# Patient Record
Sex: Female | Born: 1960 | Race: White | Hispanic: No | Marital: Married | Smoking: Former smoker
Health system: Southern US, Community
[De-identification: ages and names within clinical notes are randomized; demographics above are authoritative.]

## PROBLEM LIST (undated history)

## (undated) DIAGNOSIS — E039 Hypothyroidism, unspecified: Secondary | ICD-10-CM

## (undated) DIAGNOSIS — M549 Dorsalgia, unspecified: Secondary | ICD-10-CM

## (undated) DIAGNOSIS — D219 Benign neoplasm of connective and other soft tissue, unspecified: Secondary | ICD-10-CM

## (undated) HISTORY — PX: TONSILLECTOMY: SUR1361

## (undated) HISTORY — DX: Benign neoplasm of connective and other soft tissue, unspecified: D21.9

## (undated) HISTORY — DX: Dorsalgia, unspecified: M54.9

## (undated) HISTORY — DX: Hypothyroidism, unspecified: E03.9

## (undated) HISTORY — PX: WISDOM TOOTH EXTRACTION: SHX21

## (undated) HISTORY — PX: CLAVICLE SURGERY: SHX598

---

## 1998-10-13 ENCOUNTER — Ambulatory Visit (HOSPITAL_COMMUNITY): Admission: RE | Admit: 1998-10-13 | Discharge: 1998-10-13 | Payer: Self-pay

## 1998-10-13 ENCOUNTER — Emergency Department (HOSPITAL_COMMUNITY): Admission: EM | Admit: 1998-10-13 | Discharge: 1998-10-13 | Payer: Self-pay | Admitting: Emergency Medicine

## 2000-05-06 ENCOUNTER — Other Ambulatory Visit: Admission: RE | Admit: 2000-05-06 | Discharge: 2000-05-06 | Payer: Self-pay | Admitting: Family Medicine

## 2001-06-04 ENCOUNTER — Other Ambulatory Visit: Admission: RE | Admit: 2001-06-04 | Discharge: 2001-06-04 | Payer: Self-pay | Admitting: Obstetrics and Gynecology

## 2001-06-04 ENCOUNTER — Encounter (INDEPENDENT_AMBULATORY_CARE_PROVIDER_SITE_OTHER): Payer: Self-pay | Admitting: Specialist

## 2001-07-20 ENCOUNTER — Other Ambulatory Visit: Admission: RE | Admit: 2001-07-20 | Discharge: 2001-07-20 | Payer: Self-pay | Admitting: Internal Medicine

## 2001-09-22 HISTORY — PX: CERVICAL BIOPSY  W/ LOOP ELECTRODE EXCISION: SUR135

## 2002-01-29 ENCOUNTER — Other Ambulatory Visit: Admission: RE | Admit: 2002-01-29 | Discharge: 2002-01-29 | Payer: Self-pay | Admitting: Obstetrics and Gynecology

## 2002-02-06 ENCOUNTER — Emergency Department (HOSPITAL_COMMUNITY): Admission: EM | Admit: 2002-02-06 | Discharge: 2002-02-06 | Payer: Self-pay | Admitting: Emergency Medicine

## 2002-08-24 ENCOUNTER — Other Ambulatory Visit: Admission: RE | Admit: 2002-08-24 | Discharge: 2002-08-24 | Payer: Self-pay | Admitting: Obstetrics and Gynecology

## 2003-04-25 ENCOUNTER — Encounter (INDEPENDENT_AMBULATORY_CARE_PROVIDER_SITE_OTHER): Payer: Self-pay | Admitting: Specialist

## 2003-04-25 ENCOUNTER — Ambulatory Visit (HOSPITAL_COMMUNITY): Admission: RE | Admit: 2003-04-25 | Discharge: 2003-04-25 | Payer: Self-pay | Admitting: Family Medicine

## 2003-10-04 ENCOUNTER — Other Ambulatory Visit: Admission: RE | Admit: 2003-10-04 | Discharge: 2003-10-04 | Payer: Self-pay | Admitting: Obstetrics and Gynecology

## 2005-01-31 ENCOUNTER — Other Ambulatory Visit: Admission: RE | Admit: 2005-01-31 | Discharge: 2005-01-31 | Payer: Self-pay | Admitting: Obstetrics and Gynecology

## 2005-05-08 ENCOUNTER — Encounter: Admission: RE | Admit: 2005-05-08 | Discharge: 2005-08-06 | Payer: Self-pay | Admitting: Family Medicine

## 2005-08-21 ENCOUNTER — Encounter: Admission: RE | Admit: 2005-08-21 | Discharge: 2005-11-19 | Payer: Self-pay | Admitting: Sports Medicine

## 2006-03-06 ENCOUNTER — Encounter: Admission: RE | Admit: 2006-03-06 | Discharge: 2006-06-04 | Payer: Self-pay | Admitting: Physician Assistant

## 2006-12-30 ENCOUNTER — Ambulatory Visit: Payer: Self-pay | Admitting: Family Medicine

## 2007-01-01 ENCOUNTER — Ambulatory Visit: Payer: Self-pay | Admitting: Family Medicine

## 2007-01-15 ENCOUNTER — Ambulatory Visit: Payer: Self-pay | Admitting: Family Medicine

## 2007-01-21 ENCOUNTER — Ambulatory Visit: Payer: Self-pay | Admitting: Family Medicine

## 2007-01-22 ENCOUNTER — Ambulatory Visit: Payer: Self-pay | Admitting: Family Medicine

## 2007-01-29 ENCOUNTER — Ambulatory Visit: Payer: Self-pay | Admitting: Family Medicine

## 2007-02-05 ENCOUNTER — Ambulatory Visit: Payer: Self-pay | Admitting: Family Medicine

## 2007-02-12 ENCOUNTER — Ambulatory Visit: Payer: Self-pay | Admitting: Family Medicine

## 2007-02-19 ENCOUNTER — Ambulatory Visit: Payer: Self-pay | Admitting: Family Medicine

## 2007-02-23 ENCOUNTER — Other Ambulatory Visit: Admission: RE | Admit: 2007-02-23 | Discharge: 2007-02-23 | Payer: Self-pay | Admitting: Obstetrics & Gynecology

## 2007-02-26 ENCOUNTER — Ambulatory Visit: Payer: Self-pay | Admitting: Family Medicine

## 2007-03-05 ENCOUNTER — Ambulatory Visit: Payer: Self-pay | Admitting: Family Medicine

## 2007-03-12 ENCOUNTER — Ambulatory Visit: Payer: Self-pay | Admitting: Family Medicine

## 2007-03-19 ENCOUNTER — Ambulatory Visit: Payer: Self-pay | Admitting: Family Medicine

## 2007-03-26 ENCOUNTER — Ambulatory Visit: Payer: Self-pay | Admitting: Family Medicine

## 2007-04-16 ENCOUNTER — Ambulatory Visit: Payer: Self-pay | Admitting: Family Medicine

## 2007-05-07 ENCOUNTER — Ambulatory Visit: Payer: Self-pay | Admitting: Family Medicine

## 2008-02-25 ENCOUNTER — Other Ambulatory Visit: Admission: RE | Admit: 2008-02-25 | Discharge: 2008-02-25 | Payer: Self-pay | Admitting: Obstetrics & Gynecology

## 2009-04-07 ENCOUNTER — Other Ambulatory Visit: Admission: RE | Admit: 2009-04-07 | Discharge: 2009-04-07 | Payer: Self-pay | Admitting: Obstetrics & Gynecology

## 2011-04-25 ENCOUNTER — Emergency Department (HOSPITAL_COMMUNITY): Payer: BC Managed Care – PPO

## 2011-04-25 ENCOUNTER — Emergency Department (HOSPITAL_COMMUNITY)
Admission: EM | Admit: 2011-04-25 | Discharge: 2011-04-25 | Disposition: A | Discharge status: Home / Self Care | Payer: BC Managed Care – PPO | Attending: Emergency Medicine | Admitting: Emergency Medicine

## 2011-04-25 DIAGNOSIS — R161 Splenomegaly, not elsewhere classified: Secondary | ICD-10-CM | POA: Insufficient documentation

## 2011-04-25 DIAGNOSIS — R109 Unspecified abdominal pain: Secondary | ICD-10-CM | POA: Insufficient documentation

## 2011-04-25 DIAGNOSIS — R110 Nausea: Secondary | ICD-10-CM | POA: Insufficient documentation

## 2011-04-25 LAB — URINALYSIS, ROUTINE W REFLEX MICROSCOPIC
Bilirubin Urine: NEGATIVE
Glucose, UA: NEGATIVE mg/dL
Hgb urine dipstick: NEGATIVE
Ketones, ur: NEGATIVE mg/dL
Nitrite: NEGATIVE
Protein, ur: NEGATIVE mg/dL
Specific Gravity, Urine: 1.02 (ref 1.005–1.030)
Urobilinogen, UA: 0.2 mg/dL (ref 0.0–1.0)
pH: 7 (ref 5.0–8.0)

## 2011-04-25 LAB — CBC
HCT: 40.5 % (ref 36.0–46.0)
Hemoglobin: 13.2 g/dL (ref 12.0–15.0)
MCH: 31.8 pg (ref 26.0–34.0)
MCHC: 32.6 g/dL (ref 30.0–36.0)
MCV: 97.6 fL (ref 78.0–100.0)
Platelets: 295 10*3/uL (ref 150–400)
RBC: 4.15 MIL/uL (ref 3.87–5.11)
RDW: 13.4 % (ref 11.5–15.5)
WBC: 14.9 10*3/uL — ABNORMAL HIGH (ref 4.0–10.5)

## 2011-04-25 LAB — DIFFERENTIAL
Basophils Absolute: 0 10*3/uL (ref 0.0–0.1)
Basophils Relative: 0 % (ref 0–1)
Eosinophils Absolute: 0.1 10*3/uL (ref 0.0–0.7)
Eosinophils Relative: 1 % (ref 0–5)
Lymphocytes Relative: 6 % — ABNORMAL LOW (ref 12–46)
Lymphs Abs: 0.9 10*3/uL (ref 0.7–4.0)
Monocytes Absolute: 1.2 10*3/uL — ABNORMAL HIGH (ref 0.1–1.0)
Monocytes Relative: 8 % (ref 3–12)
Neutro Abs: 12.8 10*3/uL — ABNORMAL HIGH (ref 1.7–7.7)
Neutrophils Relative %: 86 % — ABNORMAL HIGH (ref 43–77)

## 2011-04-25 LAB — COMPREHENSIVE METABOLIC PANEL
ALT: 77 U/L — ABNORMAL HIGH (ref 0–35)
AST: 138 U/L — ABNORMAL HIGH (ref 0–37)
Alkaline Phosphatase: 73 U/L (ref 39–117)
CO2: 26 mEq/L (ref 19–32)
Calcium: 9.3 mg/dL (ref 8.4–10.5)
Chloride: 106 mEq/L (ref 96–112)
GFR calc Af Amer: >60 mL/min (ref >60)
GFR calc non Af Amer: >60 mL/min (ref >60)
Potassium: 3.9 mEq/L (ref 3.5–5.1)
Sodium: 142 mEq/L (ref 135–145)

## 2012-02-10 ENCOUNTER — Ambulatory Visit (INDEPENDENT_AMBULATORY_CARE_PROVIDER_SITE_OTHER): Payer: BC Managed Care – PPO | Admitting: Family Medicine

## 2012-02-10 ENCOUNTER — Encounter: Payer: Self-pay | Admitting: Family Medicine

## 2012-02-10 DIAGNOSIS — M461 Sacroiliitis, not elsewhere classified: Secondary | ICD-10-CM

## 2012-02-10 DIAGNOSIS — S335XXA Sprain of ligaments of lumbar spine, initial encounter: Secondary | ICD-10-CM

## 2012-02-10 DIAGNOSIS — M47818 Spondylosis without myelopathy or radiculopathy, sacral and sacrococcygeal region: Secondary | ICD-10-CM | POA: Insufficient documentation

## 2012-02-10 DIAGNOSIS — S39012A Strain of muscle, fascia and tendon of lower back, initial encounter: Secondary | ICD-10-CM | POA: Insufficient documentation

## 2012-02-10 MED ORDER — OXYCODONE-ACETAMINOPHEN 5-325 MG PO TABS: ORAL_TABLET | ORAL | Status: DC

## 2012-02-10 MED ORDER — CYCLOBENZAPRINE HCL 10 MG PO TABS: 10.0000 mg | ORAL_TABLET | Freq: Three times a day (TID) | ORAL | Status: AC | PRN

## 2012-02-10 NOTE — Progress Notes (Signed)
Office Note 02/10/2012  CC:  Chief Complaint  Patient presents with  . Establish Care    back pain    HPI:  Jocelyn Nelson is a 51 y.o. White female who is here to establish care and discuss back pain. Patient's most recent primary MD: "haven't had one". Old records were not reviewed prior to or during today's visit.  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  No acute strain was felt, no direct trauma, no fall.  Eventually got to the point yesterday evening that she could only lye on her side in bed and couldn't move without extreme pain.  No radiation of the pain, no paresthesias, no loss of bowel or bladder control, no LE weakness.   Has never had back pain this severe before, has never had x-rays of back or seen a specialist in the past.  Past Medical History  Diagnosis Date  . Back pain     Mild mid/low back pain probs x years assoc with horseback riding--no distinct injury or trauma    History reviewed. No pertinent past surgical history.  Family History  Problem Relation Age of Onset  . Arthritis Mother   . Alcohol abuse Father   . Arthritis Father   . Heart disease Paternal Grandfather     History   Social History  . Marital Status: Married    Spouse Name: N/A    Number of Children: N/A  . Years of Education: N/A   Occupational History  . Not on file.   Social History Main Topics  . Smoking status: Former Smoker    Types: Cigarettes    Quit date: 12/24/1991  . Smokeless tobacco: Never Used  . Alcohol Use: No  . Drug Use: No  . Sexually Active: Not on file   Other Topics Concern  . 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    MEDS: flexeril 10mg  x 1 last night, ibuprofen 600mg  last night  No Known Allergies  ROS Review of  Systems  Constitutional: Negative for fever and fatigue.  HENT: Negative for congestion and sore throat.   Eyes: Negative for visual disturbance.  Respiratory: Negative for cough.   Cardiovascular: Negative for chest pain.  Gastrointestinal: Negative for nausea and abdominal pain.  Genitourinary: Negative for dysuria.  Musculoskeletal: Positive for gait problem. Negative for joint swelling.       See HPI  Skin: Negative for rash.  Neurological: Negative for weakness and headaches.  Hematological: Negative for adenopathy.  Psychiatric/Behavioral: Negative for dysphoric mood.    PE; Blood pressure 88/61, pulse 62, temperature 97.9 F (36.6 C), temperature source Oral, SpO2 99.00%. Gen: Alert, lying on her side on exam table after having to be brought in to office in a wheelchair.  Patient is oriented to person, place, time, and situation.  Moves extremely slowly due to pain, often wincing/grimacing. CV: RRR, no m/r/g.   LUNGS: CTA bilat, nonlabored resps, good aeration in all lung fields. ABD: soft, NT, ND, BS normal. EXT: no clubbing, cyanosis, or edema.  BACK: moderate right sided SI joint tenderness around junction of L/S area, mild gluteal area TTP on right.  SLR neg bilat.  LE strength 5/5 prox and dist.  DTRs 2+ patellar, trace achilles bilat. She can hardly move to sit up  or roll over due to her pain.  Pertinent labs:  none  ASSESSMENT AND PLAN:   New Pt: no old records to obtain except GYN records but pt cannot recall who she sees.  Low back strain Focus is most likely right SI joint area but certainly could involve L/S soft tissues diffusely.  No sign of nerve entrapment/radiculopathy. Plan is to start celebrex 200mg  qd with food, order PT at [facility]--start ASAP, start percocet 5/325 1-2 q6h prn, #60, no RF as well as flexeril 10mg  q8h prn, #30, RF x 1. F/u in office 1 wk.     Return in about 1 week (around 02/17/2012) for f/u LBP.

## 2012-02-10 NOTE — Assessment & Plan Note (Signed)
Focus is most likely right SI joint area but certainly could involve L/S soft tissues diffusely.  No sign of nerve entrapment/radiculopathy. Plan is to start celebrex 200mg  qd with food, order PT at [facility]--start ASAP, start percocet 5/325 1-2 q6h prn, #60, no RF as well as flexeril 10mg  q8h prn, #30, RF x 1. F/u in office 1 wk.

## 2012-02-17 ENCOUNTER — Ambulatory Visit: Payer: BC Managed Care – PPO | Admitting: Family Medicine

## 2012-02-17 DIAGNOSIS — Z0289 Encounter for other administrative examinations: Secondary | ICD-10-CM

## 2012-02-18 ENCOUNTER — Other Ambulatory Visit: Payer: Self-pay | Admitting: Orthopedic Surgery

## 2012-02-18 DIAGNOSIS — M5126 Other intervertebral disc displacement, lumbar region: Secondary | ICD-10-CM

## 2012-02-20 ENCOUNTER — Ambulatory Visit
Admission: RE | Admit: 2012-02-20 | Discharge: 2012-02-20 | Disposition: A | Discharge status: Home / Self Care | Payer: BC Managed Care – PPO | Source: Ambulatory Visit | Attending: Orthopedic Surgery | Admitting: Orthopedic Surgery

## 2012-02-20 DIAGNOSIS — M5126 Other intervertebral disc displacement, lumbar region: Secondary | ICD-10-CM

## 2012-05-24 ENCOUNTER — Encounter (HOSPITAL_BASED_OUTPATIENT_CLINIC_OR_DEPARTMENT_OTHER): Payer: Self-pay | Admitting: Emergency Medicine

## 2012-05-24 ENCOUNTER — Emergency Department (HOSPITAL_BASED_OUTPATIENT_CLINIC_OR_DEPARTMENT_OTHER)
Admission: EM | Admit: 2012-05-24 | Discharge: 2012-05-24 | Disposition: A | Discharge status: Home / Self Care | Payer: BC Managed Care – PPO | Attending: Emergency Medicine | Admitting: Emergency Medicine

## 2012-05-24 ENCOUNTER — Emergency Department (HOSPITAL_BASED_OUTPATIENT_CLINIC_OR_DEPARTMENT_OTHER): Payer: BC Managed Care – PPO

## 2012-05-24 DIAGNOSIS — Z87891 Personal history of nicotine dependence: Secondary | ICD-10-CM | POA: Insufficient documentation

## 2012-05-24 DIAGNOSIS — S9780XA Crushing injury of unspecified foot, initial encounter: Secondary | ICD-10-CM

## 2012-05-24 DIAGNOSIS — W208XXA Other cause of strike by thrown, projected or falling object, initial encounter: Secondary | ICD-10-CM | POA: Insufficient documentation

## 2012-05-24 MED ORDER — HYDROCODONE-ACETAMINOPHEN 5-500 MG PO TABS: 1.0000 | ORAL_TABLET | Freq: Four times a day (QID) | ORAL | Status: AC | PRN

## 2012-05-24 MED ORDER — FENTANYL CITRATE 0.05 MG/ML IJ SOLN
50.0000 ug | Freq: Once | INTRAMUSCULAR | Status: AC
Start: 2012-05-24 — End: 2012-05-24
  Administered 2012-05-24: 100 ug via NASAL
  Filled 2012-05-24: qty 2

## 2012-05-24 NOTE — Discharge Instructions (Signed)
Crush Injury, Fingers or Toes  A crush injury to the fingers or toes means the tissues have been damaged by being squeezed (compressed). There will be bleeding into the tissues and swelling. Often, blood will collect under the skin. When this happens, the skin on the finger often dies and may slough off (shed) 1 week to 10 days later. Usually, new skin is growing underneath. If the injury has been too severe and the tissue does not survive, the damaged tissue may begin to turn black over several days.   Wounds which occur because of the crushing may be stitched (sutured) shut. However, crush injuries are more likely to become infected than other injuries.These wounds may not be closed as tightly as other types of cuts to prevent infection. Nails involved are often lost. These usually grow back over several weeks.   DIAGNOSIS  X-rays may be taken to see if there is any injury to the bones.  TREATMENT  Broken bones (fractures) may be treated with splinting, depending on the fracture. Often, no treatment is required for fractures of the last bone in the fingers or toes.  HOME CARE INSTRUCTIONS    The crushed part should be raised (elevated) above the heart or center of the chest as much as possible for the first several days or as directed. This helps with pain and lessens swelling. Less swelling increases the chances that the crushed part will survive.   Put ice on the injured area.   Put ice in a plastic bag.   Place a towel between your skin and the bag.   Leave the ice on for 15 to 20 minutes, 3 to 4 times a day for the first 2 days.   Only take over-the-counter or prescription medicines for pain, discomfort, or fever as directed by your caregiver.   Use your injured part only as directed.   Change your bandages (dressings) as directed.   Keep all follow-up appointments as directed by your caregiver. Not keeping your appointment could result in a chronic or permanent injury, pain, and disability. If there  is any problem keeping the appointment, you must call to reschedule.  SEEK IMMEDIATE MEDICAL CARE IF:    There is redness, swelling, or increasing pain in the wound area.   Pus is coming from the wound.   You have a fever.   You notice a bad smell coming from the wound or dressing.   The edges of the wound do not stay together after the sutures have been removed.   You are unable to move the injured finger or toe.  MAKE SURE YOU:    Understand these instructions.   Will watch your condition.   Will get help right away if you are not doing well or get worse.  Document Released: 12/09/2005 Document Revised: 11/28/2011 Document Reviewed: 04/26/2011  Exit[employer] Patient Information 2012 [employer].
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
## 2012-05-24 NOTE — ED Notes (Signed)
Patient transported to X-ray 
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
## 2013-09-15 ENCOUNTER — Telehealth: Payer: Self-pay | Admitting: Gynecology

## 2013-09-15 NOTE — Telephone Encounter (Signed)
According to our records Dr. Farrel Gobble put patient on Synthroid 50 mcg 09/10/12 it was sent through electronically #[id number] patient was supposed to follow up with [place] for a 6 week recheck. Patient cancelled that appointment.  S/w patient she has been on Armour Thyroid which was prescribed by her PCP but would like to switch to Brand Synthroid. Told patient she would need to contact PCP since they have been the ones following up with her and her tsh level.  Patient agreeable and appreciative.

## 2013-09-15 NOTE — Telephone Encounter (Signed)
Refill requested for brand name only Synthroid 15 mg.  Jocelyn Nelson [phone]

## 2013-09-20 ENCOUNTER — Encounter: Payer: Self-pay | Admitting: Gynecology

## 2013-09-20 ENCOUNTER — Ambulatory Visit (INDEPENDENT_AMBULATORY_CARE_PROVIDER_SITE_OTHER): Payer: BC Managed Care – PPO | Admitting: Gynecology

## 2013-09-20 VITALS — BP 108/68 | HR 78 | Resp 14 | Ht 68.0 in | Wt 193.0 lb

## 2013-09-20 DIAGNOSIS — Z01419 Encounter for gynecological examination (general) (routine) without abnormal findings: Secondary | ICD-10-CM

## 2013-09-20 DIAGNOSIS — Z9889 Other specified postprocedural states: Secondary | ICD-10-CM | POA: Insufficient documentation

## 2013-09-20 DIAGNOSIS — E559 Vitamin D deficiency, unspecified: Secondary | ICD-10-CM

## 2013-09-20 DIAGNOSIS — Z124 Encounter for screening for malignant neoplasm of cervix: Secondary | ICD-10-CM

## 2013-09-20 DIAGNOSIS — Z0000 Encounter for general adult medical examination without abnormal findings: Secondary | ICD-10-CM

## 2013-09-20 LAB — POCT URINALYSIS DIPSTICK
Urobilinogen, UA: NEGATIVE
pH, UA: 5

## 2013-09-20 NOTE — Patient Instructions (Signed)

## 2013-09-20 NOTE — Progress Notes (Signed)
52 y.o. Married Caucasian female   Jocelyn Nelson here for annual exam. Pt reports menses are regular.  She does report hot flashes, does have night sweats, slight have vaginal dryness.  She is not using lubricants.  She does report regular menses, may have skipped September.   Patient's last menstrual period was 08/23/2013.          Sexually active: yes  The current method of family planning is condoms most of the time.    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 Pap Smear  04/23/1978  . Tetanus/tdap  04/24/1979  . Mammogram  04/23/2010  . Colonoscopy  04/23/2010  . Influenza Vaccine  07/23/2013    Family History  Problem Relation Age of Onset  . Arthritis Mother   . Diabetes Mother   . Skin cancer Mother   . Alcohol abuse Father   . Arthritis Father   . Skin cancer Father   . Heart disease Paternal Grandfather     Patient Active Problem List   Diagnosis Date Noted  . 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 Low back strain 02/10/2012    Past Medical History  Diagnosis Date  . Back pain     Mild mid/low back pain probs x years assoc with horseback riding--no distinct injury or trauma  . Fibroid     x1    Past Surgical History  Procedure Laterality Date  . Cervical biopsy  w/ loop electrode excision  10/02    CIN I & II + endo cx margin  . Tonsillectomy    . Wisdom tooth extraction    . Clavicle surgery      Allergies: Review of patient's allergies indicates no known allergies.  Current Outpatient Prescriptions  Medication Sig Dispense Refill  . Cholecalciferol (VITAMIN D PO) Take by mouth.      . doxylamine, Sleep, (UNISOM) 25 MG tablet Take 50 mg by mouth at bedtime as needed. For sleep      . MAGNESIUM PO Take by  mouth.       No current facility-administered medications for this visit.    ROS: Pertinent items are noted in HPI.  Exam:    Ht 5\' 8"  (1.727 m)  Wt 193 lb (87.544 kg)  BMI 29.35 kg/m2  LMP 08/23/2013 Weight change: @WEIGHTCHANGE @ Last 3 height recordings:  Ht Readings from Last 3 Encounters:  09/20/13 5\' 8"  (1.727 m)  05/24/12 5\' 8"  (1.727 m)   General appearance: alert, cooperative and appears stated age Head: Normocephalic, without obvious abnormality, atraumatic Neck: no adenopathy, no carotid bruit, no JVD, supple, symmetrical, trachea midline and thyroid not enlarged, symmetric, no tenderness/mass/nodules Lungs: clear to auscultation bilaterally Breasts: normal appearance, no masses or tenderness, positive findings: questionable deep 0.5cm mass at 3:30, right breast on upright exam , tender Heart: regular rate and rhythm, S1, S2 normal, no murmur, click, rub or gallop Abdomen: soft, non-tender; bowel sounds normal; no masses,  no organomegaly Extremities: extremities normal, atraumatic, no cyanosis or edema Skin: Skin color, texture, turgor normal. No rashes or lesions Lymph nodes: Cervical, supraclavicular, and axillary nodes normal. no inguinal nodes palpated Neurologic: Grossly normal   Pelvic: External genitalia:  no lesions  Urethra: normal appearing urethra with no masses, tenderness or lesions              Bartholins and Skenes: normal                 Vagina: normal appearing vagina with normal color and discharge, no lesions              Cervix: normal appearance              Pap taken: yes        Bimanual Exam:  Uterus:  uterus is normal size, shape, consistency and nontender                                      Adnexa:    normal adnexa in size, nontender and no masses                                      Rectovaginal: Confirms                                      Anus:  normal sphincter tone, no lesions  A: well woman Perimenopause breast  mass Vit def     P: due for menses, will recheck breast afterwards. If still present diagnostic mammogram-stressed importance of imaging as mammogram overude pap smear due to h/o LEEP 2002 Vit d deficient return annually or prn Discussed PAP guideline changes, importance of weight bearing exercises, calcium, vit D and balanced diet.  An After Visit Summary was printed and given to the patient.

## 2013-09-21 LAB — VITAMIN D 25 HYDROXY (VIT D DEFICIENCY, FRACTURES): Vit D, 25-Hydroxy: 31 ng/mL (ref 30–89)

## 2014-09-21 ENCOUNTER — Ambulatory Visit: Payer: BC Managed Care – PPO | Admitting: Gynecology

## 2014-10-10 ENCOUNTER — Ambulatory Visit (INDEPENDENT_AMBULATORY_CARE_PROVIDER_SITE_OTHER): Payer: BC Managed Care – PPO | Admitting: Gynecology

## 2014-10-10 ENCOUNTER — Encounter: Payer: Self-pay | Admitting: Gynecology

## 2014-10-10 VITALS — BP 114/66 | Resp 16 | Ht 67.5 in | Wt 196.0 lb

## 2014-10-10 DIAGNOSIS — Z01419 Encounter for gynecological examination (general) (routine) without abnormal findings: Secondary | ICD-10-CM

## 2014-10-10 DIAGNOSIS — Z1211 Encounter for screening for malignant neoplasm of colon: Secondary | ICD-10-CM

## 2014-10-10 DIAGNOSIS — Z124 Encounter for screening for malignant neoplasm of cervix: Secondary | ICD-10-CM

## 2014-10-10 NOTE — Progress Notes (Signed)
53 y.o. Single Caucasian female   [id number] here for annual exam. Pt reports menses are absent since 06/2014 regular.  She does not report hot flashes, does not have night sweats, does have vaginal dryness.  She is not using lubricants.   Pt does not loose urine with horseback riding.  Cycles are getting irratic-none since July, skipped May and June  Patient's last menstrual period was 12/23/2013.          Sexually active: Yes.    The current method of family planning is post menopausal status.    Exercising: Yes.    horseback riding, walking, pilates few x's/wk Last pap: 09/20/13 Negative  Abnormal PAP: yes , Previous HGSIL: CIN II 2002 Mammogram: 08/2009 BSE: sort of  Colonoscopy: never had one DEXA: never had one  Alcohol: once/month Tobacco: no    Health Maintenance  Topic Date Due  . Tetanus/tdap  04/24/1979  . Mammogram  04/23/2010  . Colonoscopy  04/23/2010  . Influenza Vaccine  07/23/2014  . Pap Smear  09/20/2016    Family History  Problem Relation Age of Onset  . Arthritis Mother   . Diabetes Mother   . Skin cancer Mother   . Alcohol abuse Father   . Arthritis Father   . Skin cancer Father   . Heart disease Paternal Grandfather     Patient Active Problem List   Diagnosis Date Noted  . History of loop electrical excision procedure (LEEP) 09/20/2013  . 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 Low back strain 02/10/2012    Past Medical History  Diagnosis Date  . Back pain     Mild mid/low back pain probs x years assoc with horseback riding--no distinct injury or trauma  . Fibroid     x1    Past Surgical History  Procedure Laterality Date  . Cervical biopsy  w/ loop electrode excision  10/02    CIN I & II + endo cx margin  . Tonsillectomy    . Wisdom tooth extraction    . Clavicle surgery      Allergies: Review of patient's allergies indicates no known allergies.  Current Outpatient Prescriptions  Medication Sig Dispense Refill  . Cholecalciferol (VITAMIN D  PO) Take by mouth.      Marland Kitchen MAGNESIUM PO Take by mouth.      . thyroid (ARMOUR) 30 MG tablet Take 30 mg by mouth daily before breakfast.       No current facility-administered medications for this visit.    ROS: Pertinent items are noted in HPI.  Exam:    BP 114/66  Resp 16  Ht 5' 7.5" (1.715 m)  Wt 196 lb (88.905 kg)  BMI 30.23 kg/m2  LMP 12/23/2013 Weight change: @WEIGHTCHANGE @ Last 3 height recordings:  Ht Readings from Last 3 Encounters:  10/10/14 5' 7.5" (1.715 m)  09/20/13 5\' 8"  (1.727 m)  05/24/12 5\' 8"  (1.727 m)   General appearance: alert, cooperative and appears stated age Head: Normocephalic, without obvious abnormality, atraumatic Neck: no adenopathy, no carotid bruit, no JVD, supple, symmetrical, trachea midline and thyroid not enlarged, symmetric, no tenderness/mass/nodules Lungs: clear to auscultation bilaterally Breasts: Inspection negative, No nipple retraction or dimpling, No nipple discharge or bleeding, No axillary or supraclavicular adenopathy, dense Heart: regular rate and rhythm, S1, S2 normal, no murmur, click, rub or gallop Abdomen: soft, non-tender; bowel sounds normal; no masses,  no organomegaly Extremities: extremities normal, atraumatic, no cyanosis or edema Skin: Skin color, texture, turgor normal. No rashes or lesions  Lymph nodes: Cervical, supraclavicular, and axillary nodes normal. no inguinal nodes palpated Neurologic: Grossly normal   Pelvic: External genitalia:  normal escutcheon              Urethra: normal appearing urethra with no masses, tenderness or lesions              Bartholins and Skenes: Bartholin's, Urethra, Skene's normal                 Vagina: normal appearing vagina with normal color and discharge, no lesions              Cervix: normal appearance              Pap taken: Yes.          Bimanual Exam:  Uterus:  uterus is normal size, shape, consistency and nontender                                      Adnexa:    normal  adnexa in size, nontender and no masses                                      Rectovaginal: Confirms                                      Anus:  normal sphincter tone, no lesions         P: 1. Encounter for routine gynecological examination mammogram pap smear counseled on breast self exam, mammography screening, menopause, adequate intake of calcium and vitamin D, diet and exercise Pt's weight has yo-yo'd due to calorie restrictive dieting on and off-discussed importance of slow weight loss and maintaining return annually or prn Discussed PAP guideline changes, importance of weight bearing exercises, calcium, vit D and balanced diet.   2. Screening for cervical cancer H/o LEEP 2002, annual pap, neg HPV in 2013 - PAP Smear Only (IPS)  3. Colon cancer screening   - Ambulatory referral to Gastroenterology  An After Visit Summary was printed and given to the patient.

## 2014-10-12 LAB — IPS PAP SMEAR ONLY

## 2014-10-24 ENCOUNTER — Encounter: Payer: Self-pay | Admitting: Gynecology

## 2014-10-26 ENCOUNTER — Telehealth: Payer: Self-pay

## 2014-10-26 NOTE — Telephone Encounter (Signed)
Left message to call [place] at [phone].     Please contact the patient to see if there is a clinical question we can answer as Dr. Charlies Constable is no longer providing clinical care here.         Josefa Half, MD            ----- Message -----     From: Laqueta Due     Sent: 10/18/2014  9:23 AM      To: Azalia Bilis, MD    Subject: referral fyi                         Per rep at Dr Youlanda Mighty office, they spoke with patient 10.23, but she refused to schedule stating that she wanted to speak with you again prior to scheduling.        Thanks,    Gabriel Cirri

## 2014-10-26 NOTE — Telephone Encounter (Signed)
Spoke with patient. Patient states "I did not schedule with that office because 1. I did not know I was being referred and 2. I do not want to go to a doctor that I do not know." Advised patient referral was made to get patient started for a consult to have screening colonoscopy. "I understand that but can I not pick my own doctor?" Advised patient that we were just trying to help and it will be perfectly fine for her to choose a doctor she is more comfortable with. Advised we want her to be seen with someone she that is familiar to her if she has someone in mind. Patient is agreeable and states that she will find her own doctor to be seen with.  Routing to provider for final review. Patient agreeable to disposition. Will close encounter

## 2014-10-28 ENCOUNTER — Telehealth: Payer: Self-pay | Admitting: Emergency Medicine

## 2014-12-26 NOTE — Telephone Encounter (Signed)
Encounter opened erroneously.   Closed encounter.   

## 2016-04-15 ENCOUNTER — Telehealth: Payer: Self-pay | Admitting: *Deleted

## 2016-04-15 NOTE — Telephone Encounter (Signed)
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 Please advise if OK to complete current 08 Recall.

## 2016-04-23 NOTE — Telephone Encounter (Signed)
OK to complete 08 recall.  Thanks.

## 2016-04-25 NOTE — Telephone Encounter (Signed)
08 Recall completed.  Closing encounter.

## 2016-09-17 ENCOUNTER — Emergency Department (HOSPITAL_BASED_OUTPATIENT_CLINIC_OR_DEPARTMENT_OTHER): Payer: BLUE CROSS/BLUE SHIELD

## 2016-09-17 ENCOUNTER — Emergency Department (HOSPITAL_BASED_OUTPATIENT_CLINIC_OR_DEPARTMENT_OTHER)
Admission: EM | Admit: 2016-09-17 | Discharge: 2016-09-17 | Disposition: A | Discharge status: Home / Self Care | Payer: BLUE CROSS/BLUE SHIELD | Attending: Emergency Medicine | Admitting: Emergency Medicine

## 2016-09-17 ENCOUNTER — Encounter (HOSPITAL_BASED_OUTPATIENT_CLINIC_OR_DEPARTMENT_OTHER): Payer: Self-pay | Admitting: *Deleted

## 2016-09-17 DIAGNOSIS — Z79899 Other long term (current) drug therapy: Secondary | ICD-10-CM | POA: Diagnosis not present

## 2016-09-17 DIAGNOSIS — Y929 Unspecified place or not applicable: Secondary | ICD-10-CM | POA: Diagnosis not present

## 2016-09-17 DIAGNOSIS — E039 Hypothyroidism, unspecified: Secondary | ICD-10-CM | POA: Diagnosis not present

## 2016-09-17 DIAGNOSIS — Z87891 Personal history of nicotine dependence: Secondary | ICD-10-CM | POA: Diagnosis not present

## 2016-09-17 DIAGNOSIS — Y999 Unspecified external cause status: Secondary | ICD-10-CM | POA: Diagnosis not present

## 2016-09-17 DIAGNOSIS — R52 Pain, unspecified: Secondary | ICD-10-CM

## 2016-09-17 DIAGNOSIS — S5291XA Unspecified fracture of right forearm, initial encounter for closed fracture: Secondary | ICD-10-CM

## 2016-09-17 DIAGNOSIS — Y9389 Activity, other specified: Secondary | ICD-10-CM | POA: Diagnosis not present

## 2016-09-17 DIAGNOSIS — W1839XA Other fall on same level, initial encounter: Secondary | ICD-10-CM | POA: Insufficient documentation

## 2016-09-17 DIAGNOSIS — S0990XA Unspecified injury of head, initial encounter: Secondary | ICD-10-CM | POA: Diagnosis not present

## 2016-09-17 DIAGNOSIS — S6991XA Unspecified injury of right wrist, hand and finger(s), initial encounter: Secondary | ICD-10-CM | POA: Diagnosis present

## 2016-09-17 DIAGNOSIS — S52501A Unspecified fracture of the lower end of right radius, initial encounter for closed fracture: Secondary | ICD-10-CM | POA: Diagnosis not present

## 2016-09-17 LAB — BASIC METABOLIC PANEL
Anion gap: 9 (ref 5–15)
BUN: 15 mg/dL (ref 6–20)
CALCIUM: 8.6 mg/dL — AB (ref 8.9–10.3)
CHLORIDE: 111 mmol/L (ref 101–111)
CO2: 19 mmol/L — AB (ref 22–32)
CREATININE: 0.79 mg/dL (ref 0.44–1.00)
Glucose, Bld: 114 mg/dL — ABNORMAL HIGH (ref 65–99)
POTASSIUM: 3.5 mmol/L (ref 3.5–5.1)
SODIUM: 139 mmol/L (ref 135–145)

## 2016-09-17 LAB — CBC WITH DIFFERENTIAL/PLATELET
BASOS ABS: 0 10*3/uL (ref 0.0–0.1)
BASOS PCT: 0 %
EOS ABS: 0.1 10*3/uL (ref 0.0–0.7)
Eosinophils Relative: 1 %
HEMATOCRIT: 40.1 % (ref 36.0–46.0)
Hemoglobin: 13.2 g/dL (ref 12.0–15.0)
Lymphocytes Relative: 22 %
Lymphs Abs: 2 10*3/uL (ref 0.7–4.0)
MCH: 32.9 pg (ref 26.0–34.0)
MCHC: 32.9 g/dL (ref 30.0–36.0)
MCV: 100 fL (ref 78.0–100.0)
MONO ABS: 0.8 10*3/uL (ref 0.1–1.0)
MONOS PCT: 9 %
NEUTROS ABS: 6.1 10*3/uL (ref 1.7–7.7)
NEUTROS PCT: 68 %
Platelets: 336 10*3/uL (ref 150–400)
RBC: 4.01 MIL/uL (ref 3.87–5.11)
RDW: 12.8 % (ref 11.5–15.5)
WBC: 9 10*3/uL (ref 4.0–10.5)

## 2016-09-17 MED ORDER — SODIUM CHLORIDE 0.9 % IV BOLUS (SEPSIS)
1000.0000 mL | Freq: Once | INTRAVENOUS | Status: AC
  Administered 2016-09-17: 1000 mL via INTRAVENOUS

## 2016-09-17 MED ORDER — MORPHINE SULFATE (PF) 4 MG/ML IV SOLN
4.0000 mg | Freq: Once | INTRAVENOUS | Status: AC
  Administered 2016-09-17: 4 mg via INTRAVENOUS
  Filled 2016-09-17: qty 1

## 2016-09-17 MED ORDER — OXYCODONE-ACETAMINOPHEN 5-325 MG PO TABS: 2.0000 | ORAL_TABLET | Freq: Four times a day (QID) | ORAL | 0 refills | Status: AC | PRN

## 2016-09-17 NOTE — Discharge Instructions (Signed)
Follow-up with your primary care provider in 2 days to be reevaluated. Follow-up with Dr. Grandville Silos, orthopedics, tomorrow to be seen as early as 2 days from now for your distal radial fracture. Take the Percocet as needed for pain. Be sure to take a stool softener with this medication as it can cause constipation.  Return immediately to the emergency department if you experience numbness/tingling or weakness in the right hand, fevers, headaches, visual changes, vomiting, abdominal pain, or any other concerning symptoms.

## 2016-09-17 NOTE — ED Provider Notes (Signed)
[facility] Provider Note   CSN: [id number] Arrival date & time: 09/17/16  1824  By signing my name below, I, Jocelyn Nelson, attest that this documentation has been prepared under the direction and in the presence of [employer], PA-C. Electronically Signed: Judithann Sauger, ED Scribe. 09/17/16. 8:18 PM.   History   Chief Complaint Chief Complaint  Patient presents with  . Fall    HPI Comments: Jocelyn Nelson is a 55 y.o. female with a hx of chronic mid to low back pain and SI joint arthritis who presents to the Emergency Department complaining of gradually worsening moderate right wrist pain, 2/10 left knee pain, generalized headache, and tightness in her posterior neck s/p fall that occurred 30 minutes PTA. She explains that she was wearing a helmet when she was bucked off her horse, landing on her left side, hitting her head on the ground. No LOC. She states that she had an episode of acute visual changes, difficulty breathing, and a sensation that her head was swollen immediately after the fall but that has since resolved. No alleviating factors noted. Pt has not tried any medications PTA. She denies any abdominal pain, nausea, vomiting, back pain, hip pain, generalized rash, or any open wounds.    The history is provided by the patient. No language interpreter was used.    Past Medical History:  Diagnosis Date  . Back pain    Mild mid/low back pain probs x years assoc with horseback riding--no distinct injury or trauma  . Fibroid    x1  . Hypothyroidism     Patient Active Problem List   Diagnosis Date Noted  . History of loop electrical excision procedure (LEEP) 09/20/2013  . SI joint arthritis ([place]) 02/10/2012  . Low back strain 02/10/2012    Past Surgical History:  Procedure Laterality Date  . CERVICAL BIOPSY  W/ LOOP ELECTRODE EXCISION  10/02   CIN I & II + endo cx margin  . CLAVICLE SURGERY    . TONSILLECTOMY    . WISDOM TOOTH EXTRACTION       OB History    Gravida Para Term Preterm AB Living   3 2 2   1 2    SAB TAB Ectopic Multiple Live Births                   Home Medications    Prior to Admission medications   Medication Sig Start Date End Date Taking? Authorizing Provider  Cholecalciferol (VITAMIN D PO) Take by mouth.    Historical Provider, MD  MAGNESIUM PO Take by mouth.    Historical Provider, MD  oxyCODONE-acetaminophen (PERCOCET/ROXICET) 5-325 MG tablet Take 2 tablets by mouth every 6 (six) hours as needed for severe pain. 09/17/16   Kalman Drape, PA  thyroid (ARMOUR) 30 MG tablet Take 30 mg by mouth daily before breakfast.    Historical Provider, MD    Family History Family History  Problem Relation Age of Onset  . Arthritis Mother   . Diabetes Mother   . Skin cancer Mother   . Alcohol abuse Father   . Arthritis Father   . Skin cancer Father   . Heart disease Paternal Grandfather     Social History Social History  Substance Use Topics  . Smoking status: Former Smoker    Types: Cigarettes    Quit date: 12/24/1991  . Smokeless tobacco: Never Used  . Alcohol use No     Comment: once a month  Allergies   Review of patient's allergies indicates no known allergies.   Review of Systems Review of Systems  Gastrointestinal: Negative for abdominal pain, nausea and vomiting.  Musculoskeletal: Positive for arthralgias and neck pain. Negative for back pain.  Skin: Negative for rash and wound.  Neurological: Positive for headaches.     Physical Exam Updated Vital Signs BP (!) 76/54 (BP Location: Left Arm)   Pulse (!) 51   Temp 97.9 F (36.6 C) (Oral)   Resp 16   Ht 5\' 8"  (1.727 m)   Wt 195 lb (88.5 kg)   LMP 12/23/2013   SpO2 100%   BMI 29.65 kg/m   Physical Exam  Constitutional: She is oriented to person, place, and time. She appears well-developed and well-nourished. No distress.  HENT:  Head: Normocephalic and atraumatic.  Eyes: Conjunctivae are normal.  Neck: Normal range  of motion. Neck supple. Muscular tenderness (mild trapezious tendeness) present. No spinous process tenderness present. No neck rigidity.  Cardiovascular: Normal rate, regular rhythm, normal heart sounds and intact distal pulses.  Exam reveals no gallop and no friction rub.   No murmur heard. Pulses:      Radial pulses are 2+ on the right side, and 2+ on the left side.       Dorsalis pedis pulses are 2+ on the right side, and 2+ on the left side.  Pulmonary/Chest: Effort normal and breath sounds normal. No respiratory distress. She has no wheezes. She has no rales.  Abdominal: Soft. Bowel sounds are normal. There is no tenderness.  No ecchymosis noted to her abdomen   Musculoskeletal: Normal range of motion. She exhibits deformity.  Deformity noted to right wrist on dorsal side, TTP.  No bruising to the back, no stepoff's or deformities. NO TTP to spinous processes of C, T or L spine Full ROM of BLE, mild TTP to medial left knee at location of small bruise, no deformities of BLE, sensation intact, strength 5/5 of BLE, Pt is neurovascuarly intact distally  Neurological: She is alert and oriented to person, place, and time. She has normal strength. No cranial nerve deficit or sensory deficit. Coordination normal. GCS eye subscore is 4. GCS verbal subscore is 5. GCS motor subscore is 6.  Sensation intact Normal strength, normal gait  Skin: Skin is warm and dry. She is not diaphoretic.  Psychiatric: She has a normal mood and affect. Her behavior is normal.  Nursing note and vitals reviewed.    ED Treatments / Results  DIAGNOSTIC STUDIES: Oxygen Saturation is 100% on RA, normal by my interpretation.    COORDINATION OF CARE: 8:16 PM- Pt advised of plan for treatment and pt agrees. Pt informed of her head CT and cervical CT results. Advised to rest eyes to fully recover from concussion symptoms.    Labs (all labs ordered are listed, but only abnormal results are displayed) Labs Reviewed   BASIC METABOLIC PANEL - Abnormal; Notable for the following:       Result Value   CO2 19 (*)    Glucose, Bld 114 (*)    Calcium 8.6 (*)    All other components within normal limits  CBC WITH DIFFERENTIAL/PLATELET    EKG  EKG Interpretation  Date/Time:  Tuesday September 17 2016 19:04:15 EDT Ventricular Rate:  52 PR Interval:    QRS Duration: 95 QT Interval:  441 QTC Calculation: 411 R Axis:   85 Text Interpretation:  Sinus bradycardia Normal ECG Confirmed by GOLDSTON MD, SCOTT [phone]) on 09/17/2016  7:18:41 PM       Radiology Dg Chest 2 View  Result Date: 09/17/2016 CLINICAL DATA:  Chest pain and fell off horse. EXAM: CHEST  2 VIEW COMPARISON:  None. FINDINGS: Both lungs are clear. Heart and mediastinum are within normal limits. The trachea is midline. Negative for a pneumothorax. No large pleural effusions. Bony thorax is intact. IMPRESSION: No active cardiopulmonary disease. Electronically Signed   By: Markus Daft M.D.   On: 09/17/2016 20:12   Dg Wrist Complete Right  Result Date: 09/17/2016 CLINICAL DATA:  Right wrist pain after fall from horse back EXAM: RIGHT WRIST - COMPLETE 3+ VIEW COMPARISON:  None. FINDINGS: There is an impacted fracture of the distal right radius. No intra-articular extension is identified. Scapholunate interval is normal. No other fracture is identified. IMPRESSION: Minimally displaced, compacted fracture of the distal right radius. Electronically Signed   By: Ulyses Jarred M.D.   On: 09/17/2016 20:12   Ct Head Wo Contrast  Result Date: 09/17/2016 CLINICAL DATA:  Bucked off horse.  Headache and lower neck pain. EXAM: CT HEAD WITHOUT CONTRAST CT CERVICAL SPINE WITHOUT CONTRAST TECHNIQUE: Multidetector CT imaging of the head and cervical spine was performed following the standard protocol without intravenous contrast. Multiplanar CT image reconstructions of the cervical spine were also generated. COMPARISON:  None. FINDINGS: CT HEAD FINDINGS Brain: No  evidence for acute hemorrhage, mass lesion, midline shift, hydrocephalus or large infarct. Vascular: No hyperdense vessel or unexpected calcification. Skull: Normal. Negative for fracture or focal lesion. Sinuses/Orbits: Minimal mucosal disease in the maxillary sinuses. Other: None CT CERVICAL SPINE FINDINGS Alignment: Mild retrolisthesis at C4-C5. Normal alignment at the cervicothoracic junction. Skull base and vertebrae: No acute fracture. No primary bone lesion or focal pathologic process. Soft tissues and spinal canal: No prevertebral fluid or swelling. No visible canal hematoma. Disc levels: Mild retrolisthesis at C4-C5. Mild disc space narrowing at C4-C5. Mild disc space narrowing at C6-C7. Some degenerative endplate changes along the superior endplate of C7. Upper chest: Negative. Other: None IMPRESSION: No acute intracranial abnormality. No acute abnormality in the cervical spine. Mild degenerative changes in cervical spine. Electronically Signed   By: Markus Daft M.D.   On: 09/17/2016 19:58   Ct Cervical Spine Wo Contrast  Result Date: 09/17/2016 CLINICAL DATA:  Bucked off horse.  Headache and lower neck pain. EXAM: CT HEAD WITHOUT CONTRAST CT CERVICAL SPINE WITHOUT CONTRAST TECHNIQUE: Multidetector CT imaging of the head and cervical spine was performed following the standard protocol without intravenous contrast. Multiplanar CT image reconstructions of the cervical spine were also generated. COMPARISON:  None. FINDINGS: CT HEAD FINDINGS Brain: No evidence for acute hemorrhage, mass lesion, midline shift, hydrocephalus or large infarct. Vascular: No hyperdense vessel or unexpected calcification. Skull: Normal. Negative for fracture or focal lesion. Sinuses/Orbits: Minimal mucosal disease in the maxillary sinuses. Other: None CT CERVICAL SPINE FINDINGS Alignment: Mild retrolisthesis at C4-C5. Normal alignment at the cervicothoracic junction. Skull base and vertebrae: No acute fracture. No primary bone  lesion or focal pathologic process. Soft tissues and spinal canal: No prevertebral fluid or swelling. No visible canal hematoma. Disc levels: Mild retrolisthesis at C4-C5. Mild disc space narrowing at C4-C5. Mild disc space narrowing at C6-C7. Some degenerative endplate changes along the superior endplate of C7. Upper chest: Negative. Other: None IMPRESSION: No acute intracranial abnormality. No acute abnormality in the cervical spine. Mild degenerative changes in cervical spine. Electronically Signed   By: Markus Daft M.D.   On: 09/17/2016 19:58  Procedures Procedures (including critical care time)  Medications Ordered in ED Medications  morphine 4 MG/ML injection 4 mg (4 mg Intravenous Given 09/17/16 1940)  sodium chloride 0.9 % bolus 1,000 mL (0 mLs Intravenous Stopped 09/17/16 2156)  morphine 4 MG/ML injection 4 mg (4 mg Intravenous Given 09/17/16 2159)     Initial Impression / Assessment and Plan / ED Course  Jackson Latino, PA-C has reviewed the triage vital signs and the nursing notes.  Pertinent labs & imaging results that were available during my care of the patient were reviewed by me and considered in my medical decision making (see chart for details).  Clinical Course    Pt thrown from horse. Was hypotensive upon arrival to the ED. Given fluids. BP normalized. EKG with bradycardia and without any acute changes. No neurological deficits on exam. C spine nontender to palpation, no tenderness to abdomen. No indication for abdominal imaging at this time.   No LOC. CT of head and neck reviewed by me revealed no acute abnormalities. No concerns for intercranial bleed. With pt's history of nausea, visual changes shortly after the accident likely she has a concussion. Pt without headache in the ED.  I discussed treatment for concussion and f/u with PCP in 2 days to be reevaluated.   Pt with deformity to right wrist. Xray reviewed by me revealed minimally displaced compacted distal right  radius fracture. Pt was placed in a splint and splint care was discussed. Instructed pt to f/u with Dr. Grandville Silos hand tomorrow to be seen this week. Discussed RICE and pain medication. Instructed pt to take a stool softner while taking percocet.   Discussed strict return precautions to the ED. Pt was stable and VSS at time of discharge. I discussed all of the results with the patient and family members they have expressed their understanding to the verbal discharge instructions.  Pt case discussed and pt seen by Dr. Regenia Skeeter who agrees with the above plan.  Final Clinical Impressions(s) / ED Diagnoses   Final diagnoses:  Pain  Fall from horse, initial encounter  Right radial fracture, closed, initial encounter  Minor head injury, initial encounter    New Prescriptions Discharge Medication List as of 09/17/2016  9:41 PM     I personally performed the services described in this documentation, which was scribed in my presence. The recorded information has been reviewed and is accurate.      Kalman Drape, PA 09/19/16 1532    Sherwood Gambler, MD 09/23/16 1710

## 2016-09-17 NOTE — ED Triage Notes (Addendum)
Pt was bucked off her horse tonight.  Reports HA, right wrist pain and 'panic'.  Denies LOC.  Pt calm in triage.  No acute distress noted.  pts wrist is swollen on the thumb side.

## 2016-09-17 NOTE — ED Notes (Signed)
Patient transported to CT 

## 2019-10-29 ENCOUNTER — Other Ambulatory Visit: Payer: Self-pay | Admitting: Family Medicine

## 2019-11-02 ENCOUNTER — Other Ambulatory Visit: Payer: Self-pay | Admitting: Family Medicine

## 2019-11-02 DIAGNOSIS — M7121 Synovial cyst of popliteal space [Baker], right knee: Secondary | ICD-10-CM

## 2019-11-04 ENCOUNTER — Ambulatory Visit (HOSPITAL_COMMUNITY)
Admission: RE | Admit: 2019-11-04 | Discharge: 2019-11-04 | Disposition: A | Discharge status: Home / Self Care | Payer: BC Managed Care – PPO | Source: Ambulatory Visit | Attending: Orthopaedic Surgery | Admitting: Orthopaedic Surgery

## 2019-11-04 ENCOUNTER — Other Ambulatory Visit: Payer: Self-pay

## 2019-11-04 ENCOUNTER — Encounter: Payer: Self-pay | Admitting: Orthopaedic Surgery

## 2019-11-04 ENCOUNTER — Ambulatory Visit: Payer: BLUE CROSS/BLUE SHIELD | Admitting: Orthopaedic Surgery

## 2019-11-04 VITALS — Ht 68.0 in | Wt 200.0 lb

## 2019-11-04 DIAGNOSIS — M79661 Pain in right lower leg: Secondary | ICD-10-CM | POA: Insufficient documentation

## 2019-11-04 DIAGNOSIS — M25561 Pain in right knee: Secondary | ICD-10-CM | POA: Insufficient documentation

## 2019-11-04 DIAGNOSIS — G8929 Other chronic pain: Secondary | ICD-10-CM | POA: Diagnosis not present

## 2019-11-04 MED ORDER — LIDOCAINE HCL 1 % IJ SOLN
2.0000 mL | INTRAMUSCULAR | Status: AC | PRN
  Administered 2019-11-04: 2 mL

## 2019-11-04 NOTE — Progress Notes (Signed)
Office Visit Note   Patient: Jocelyn Nelson           Date of Birth: Oct 17, 1961           MRN: [id number] Visit Date: 11/04/2019              Requested by: No referring provider defined for this encounter. PCP: System, Provider Not In   Assessment & Plan: Visit Diagnoses:  1. Pain in right lower leg   2. Chronic pain of right knee     Plan: Right knee pain with multiple etiologies.  Insidious onset of knee pain and swelling over a month ago after riding her horse over several days.  Initially seen at the after-hours orthopedic clinic.  Fluid was aspirated from the knee but not analyzed.  She was told she might "have gout".  Today I aspirated 50 cc of greenish somewhat cloudy fluid from the knee and will send it to the lab.  Also will order a Doppler study to be sure she does not have DVT.  She recently had an ultrasound demonstrating a Baker's cyst measuring 3 x 1.8 cm with possible rupture x-rays of her knee were apparently negative from the after-hours clinic.  These were not reviewed as they were not available.  Long discussion over 70minutes regarding diagnostic possibilities.  We will know more after the above studies  Follow-Up Instructions: Return in about 1 week (around 11/11/2019).   Orders:  Orders Placed This Encounter  Procedures  . Large Joint Inj: R knee  . Anaerobic and Aerobic Culture  . Synovial cell count + diff, w/ crystals  . VAS [place] LOWER EXTREMITY VENOUS (DVT)   No orders of the defined types were placed in this encounter.     Procedures: Large Joint Inj: R knee on 11/04/2019 2:09 PM Indications: pain and diagnostic evaluation Details: 25 G 1.5 in needle, anteromedial approach  Arthrogram: No  Medications: 2 mL lidocaine 1 % Aspirate: sent for lab analysis  Aspirated 50 cc of somewhat cloudy greenish fluid Procedure, treatment alternatives, risks and benefits explained, specific risks discussed. Consent was given by the patient. Immediately prior to  procedure a time out was called to verify the correct patient, procedure, equipment, support staff and site/side marked as required. Patient was prepped and draped in the usual sterile fashion.       Clinical Data: No additional findings.   Subjective: Chief Complaint  Patient presents with  . Right Knee - Pain  Patient presents today with right knee pain X 39month. No known injury. She said that it hurts posteriorly and into her calf. She has swelling in her knee and calf. She originally went to [facility] and had x-rays taken. She had 70cc of fluid aspirated off it and was diagnosed with gout. She said that she felt better immediately, but then hurt again. Her primary care doctor ordered an ultrasound and was told she had a Bakers Cyst. She is not taking anything for pain. She uses a cane to try to keep her weight off it. Her pain is worse at night.  Definitely feeling better and less swelling of her knee but has not been very active.  Still having some calf pain.  Doppler study demonstrated Baker's cyst probably ruptured with fluid in her calf muscles.  Fluid retrieved from her right knee was not sent to the lab and presumed to be gout.  Had serum uric acid performed through her primary care physician that was normal.  No  specific injury or trauma.  She did ride her horse over about 3 days which could be the only potential cause of her problem.  No prior problem or other joint complaints  HPI  Review of Systems  Constitutional: Positive for fatigue.  HENT: Negative for ear pain.   Eyes: Negative for pain.  Respiratory: Negative for shortness of breath.   Cardiovascular: Negative for leg swelling.  Gastrointestinal: Negative for constipation and diarrhea.  Endocrine: Negative for cold intolerance and heat intolerance.  Genitourinary: Negative for difficulty urinating.  Musculoskeletal: Positive for joint swelling.  Skin: Negative for rash.  Allergic/Immunologic: Positive for food  allergies.  Neurological: Negative for weakness.  Hematological: Does not bruise/bleed easily.  Psychiatric/Behavioral: Positive for sleep disturbance.     Objective: Vital Signs: Ht 5\' 8"  (1.727 m)   Wt 200 lb (90.7 kg)   LMP 12/23/2013   BMI 30.41 kg/m   Physical Exam Constitutional:      Appearance: She is well-developed.  Eyes:     Pupils: Pupils are equal, round, and reactive to light.  Pulmonary:     Effort: Pulmonary effort is normal.  Skin:    General: Skin is warm and dry.  Neurological:     Mental Status: She is alert and oriented to person, place, and time.  Psychiatric:        Behavior: Behavior normal.     Ortho Exam small effusion right knee.  No palpable popliteal cyst.  Some pain in her calf with head extension.  I can fully extend her knee but she had difficulty actively.  I could flex over 100 degrees without instability.  No medial lateral joint pain.  No patellar crepitation .knee was not hot warm or red  Specialty Comments:  No specialty comments available.  Imaging: No results found.   PMFS History: Patient Active Problem List   Diagnosis Date Noted  . Pain in right knee 11/04/2019  . History of loop electrical excision procedure (LEEP) 09/20/2013  . 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 Low back strain 02/10/2012   Past Medical History:  Diagnosis Date  . Back pain    Mild mid/low back pain probs x years assoc with horseback riding--no distinct injury or trauma  . Fibroid    x1  . Hypothyroidism     Family History  Problem Relation Age of Onset  . Arthritis Mother   . Diabetes Mother   . Skin cancer Mother   . Alcohol abuse Father   . Arthritis Father   . Skin cancer Father   . Heart disease Paternal Grandfather     Past Surgical History:  Procedure Laterality Date  . CERVICAL BIOPSY  W/ LOOP ELECTRODE EXCISION  10/02   CIN I & II + endo cx margin  . CLAVICLE SURGERY    . TONSILLECTOMY    . WISDOM TOOTH EXTRACTION     Social  History   Occupational History  . Not on file  Tobacco Use  . Smoking status: Former Smoker    Types: Cigarettes    Quit date: 12/24/1991    Years since quitting: 27.8  . Smokeless tobacco: Never Used  Substance and Sexual Activity  . Alcohol use: No    Comment: once a month  . Drug use: No  . Sexual activity: Yes    Partners: Male

## 2019-11-04 NOTE — Progress Notes (Signed)
Right lower extremity venous duplex completed. Refer to "CV Proc" under chart review to view preliminary results.  11/04/2019 4:32 PM Kelby Aline., MHA, RVT, RDCS, RDMS

## 2019-11-08 ENCOUNTER — Telehealth: Payer: Self-pay | Admitting: Orthopaedic Surgery

## 2019-11-08 NOTE — Telephone Encounter (Signed)
Yes-I called her and discussed the MRI

## 2019-11-08 NOTE — Telephone Encounter (Signed)
Please schedule MRI right knee

## 2019-11-08 NOTE — Telephone Encounter (Signed)
Please advise 

## 2019-11-08 NOTE — Telephone Encounter (Signed)
Pt called in regards to her gout results, please give her a call [phone]

## 2019-11-08 NOTE — Telephone Encounter (Signed)
Has she already been made aware of her results and that MRI is needed?

## 2019-11-09 ENCOUNTER — Other Ambulatory Visit: Payer: Self-pay | Admitting: Orthopaedic Surgery

## 2019-11-09 DIAGNOSIS — G8929 Other chronic pain: Secondary | ICD-10-CM

## 2019-11-09 NOTE — Telephone Encounter (Signed)
Order has been placed.

## 2019-11-10 LAB — ANAEROBIC AND AEROBIC CULTURE
AER RESULT:: NO GROWTH
MICRO NUMBER:: 1094014
MICRO NUMBER:: 1094015
SPECIMEN QUALITY:: ADEQUATE
SPECIMEN QUALITY:: ADEQUATE

## 2019-11-10 LAB — SYNOVIAL CELL COUNT + DIFF, W/ CRYSTALS
Basophils, %: 0 %
Eosinophils-Synovial: 0 % (ref 0–2)
Lymphocytes-Synovial Fld: 11 % (ref 0–74)
Monocyte/Macrophage: 5 % (ref 0–69)
Neutrophil, Synovial: 83 % — ABNORMAL HIGH (ref 0–24)
Synoviocytes, %: 1 % (ref 0–15)
WBC, Synovial: 10355 cells/uL — ABNORMAL HIGH (ref <150)

## 2019-11-12 ENCOUNTER — Telehealth: Payer: Self-pay | Admitting: Orthopaedic Surgery

## 2019-11-12 NOTE — Telephone Encounter (Signed)
Patient would like to know if she could get the results of her MRI over the phone instead of coming in. Her call back number is [phone]

## 2019-11-12 NOTE — Telephone Encounter (Signed)
Spoke with patient. She is scheduled for her MRI on 11/27/2019. I explained that usually Dr.Whitfield is fine with calling patient's with the results. She is aware that he is out of the office all next week. I will verify with him and call her back with a date and time he will call her with results.

## 2019-11-21 ENCOUNTER — Other Ambulatory Visit: Payer: Self-pay

## 2019-11-21 ENCOUNTER — Ambulatory Visit
Admission: RE | Admit: 2019-11-21 | Discharge: 2019-11-21 | Disposition: A | Discharge status: Home / Self Care | Payer: BC Managed Care – PPO | Source: Ambulatory Visit | Attending: Orthopaedic Surgery | Admitting: Orthopaedic Surgery

## 2019-11-21 DIAGNOSIS — M25561 Pain in right knee: Secondary | ICD-10-CM

## 2019-11-21 DIAGNOSIS — G8929 Other chronic pain: Secondary | ICD-10-CM

## 2019-11-27 ENCOUNTER — Other Ambulatory Visit: Payer: BC Managed Care – PPO

## 2019-11-30 ENCOUNTER — Ambulatory Visit: Payer: BC Managed Care – PPO | Admitting: Orthopaedic Surgery

## 2019-11-30 ENCOUNTER — Telehealth: Payer: Self-pay | Admitting: Orthopaedic Surgery

## 2019-11-30 NOTE — Telephone Encounter (Signed)
Spoke with patient. She is aware that Dr.Whitfield will call with her results.

## 2019-11-30 NOTE — Telephone Encounter (Signed)
Patient called in requesting Dr. Durward Fortes nurse give patient a call back. Patient phone number is [phone]

## 2019-12-01 NOTE — Telephone Encounter (Signed)
Spoke with patient. She was wanting to know if the arthritis in her knee was osteoarthritis or Rheumatoid arthritis. I let her know that Dr.Whitfield said it was osteoarthritis. I also faxed over the results of the MRI to [place] at [facility] in [facility], per patient's request.

## 2020-03-03 IMAGING — MR MR KNEE*R* W/O CM
4 of 6 series · 20 of 40 positions shown · non-contrast
Comparison: None.

CLINICAL DATA: Anterior right knee pain and swelling since September 23, 2019. No known injury.

EXAM:
MRI OF THE RIGHT KNEE WITHOUT CONTRAST
TECHNIQUE: Multiplanar, multisequence MR imaging of the knee was performed. No
intravenous contrast was administered.

[Series 3: T2 fat-sat · axial · 4.0mm · 0.50mm/px · z∈[-46,+48]mm · 3 of 24 slices shown (1 of 2)]
[im 5/24]
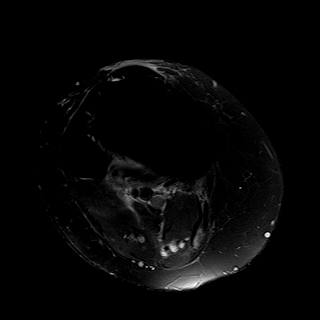
[im 14/24]
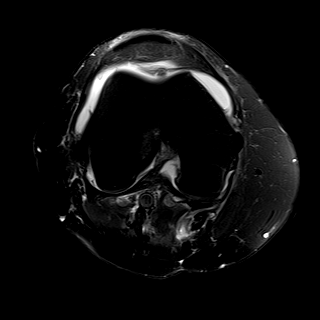
[im 24/24]
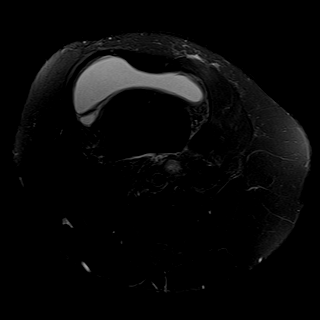

[Series 5: T2 fat-sat · coronal · 4.0mm · 0.29mm/px · 3 of 26 slices shown (2 of 2)]
[im 6/26]
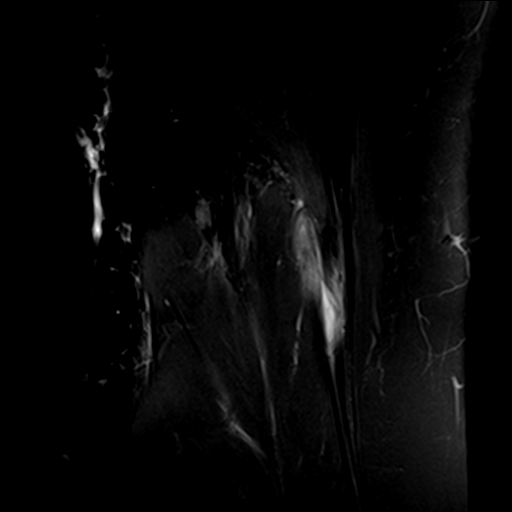
[im 16/26]
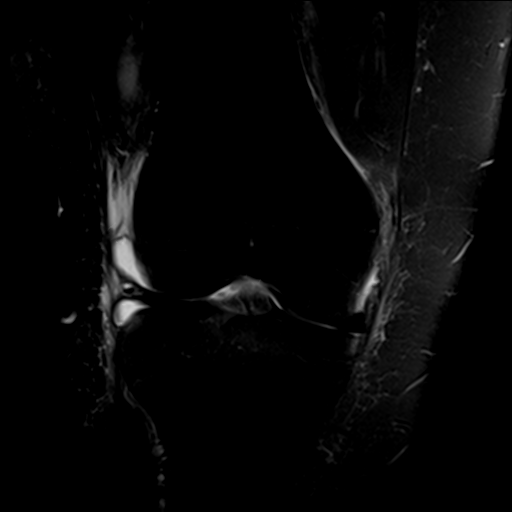
[im 26/26]
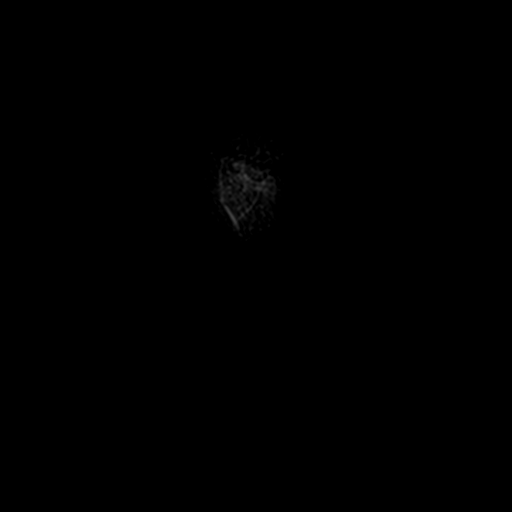

[Series 8: PD fat-sat · sagittal · 3.0mm · 0.29mm/px · 7 of 27 slices shown (1 of 2)]
[im 1/27]
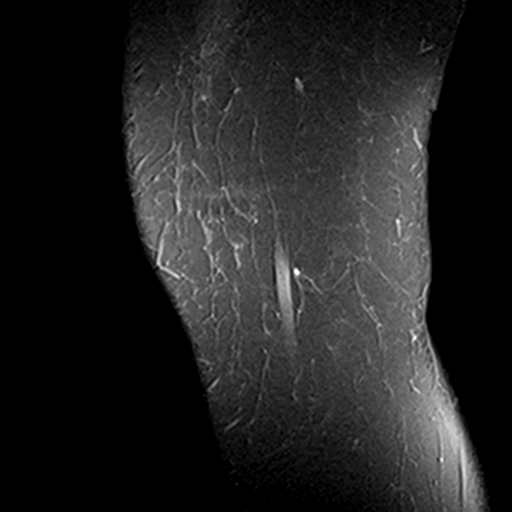
[im 5/27]
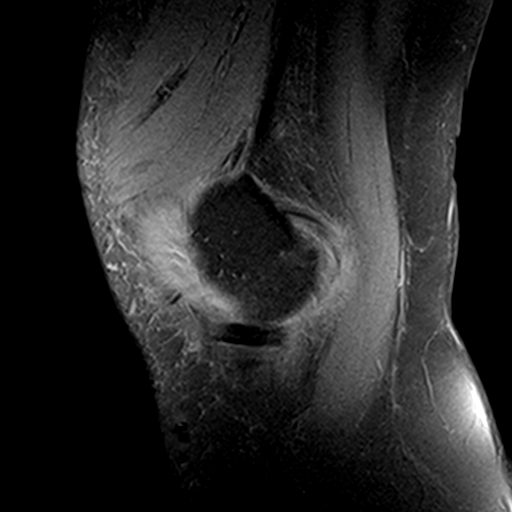
[im 9/27]
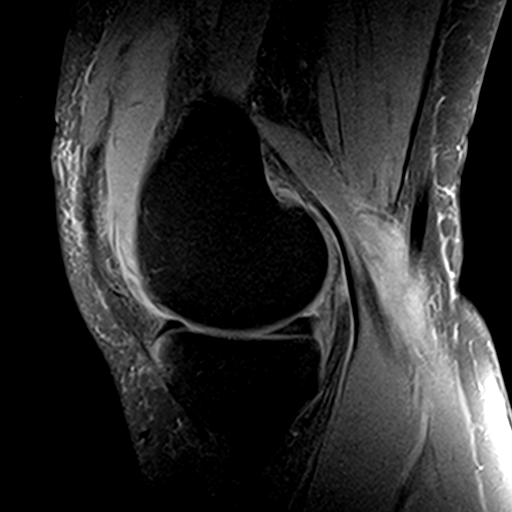
[im 14/27]
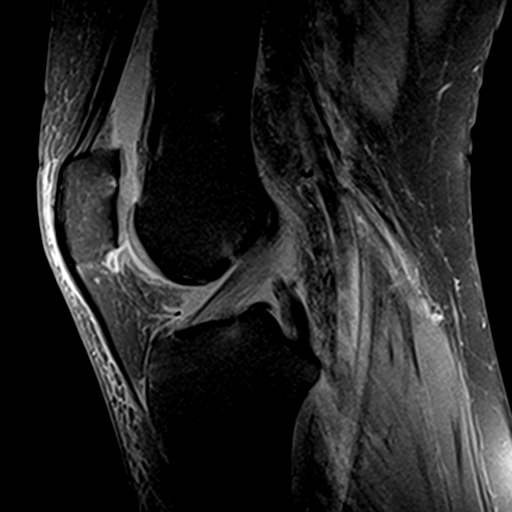
[im 18/27]
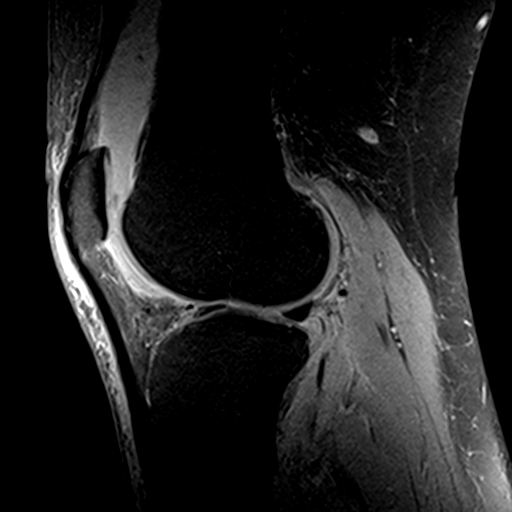
[im 22/27]
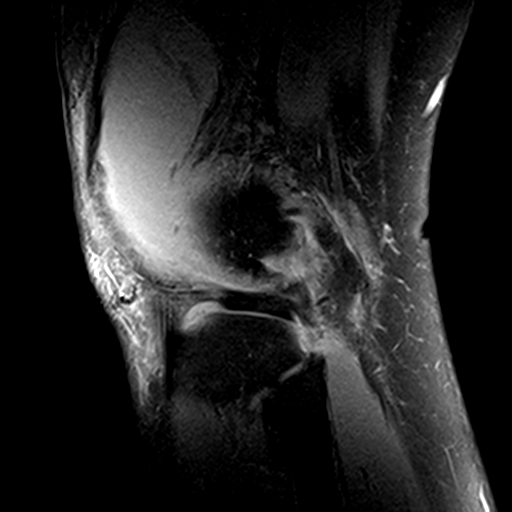
[im 27/27]
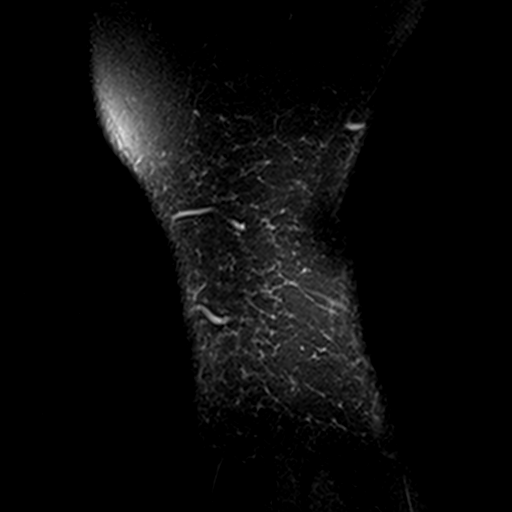

[Series 9: PD fat-sat · coronal · 3.0mm · 0.29mm/px · 7 of 33 slices shown (2 of 2)]
[im 1/33]
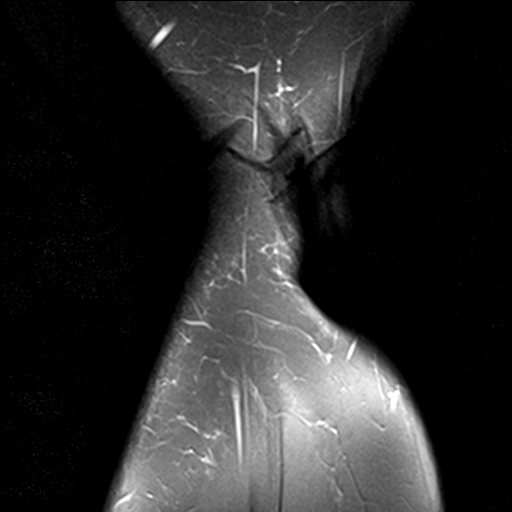
[im 5/33]
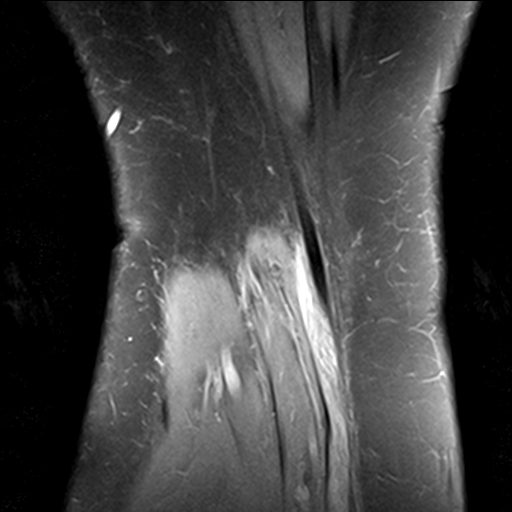
[im 10/33]
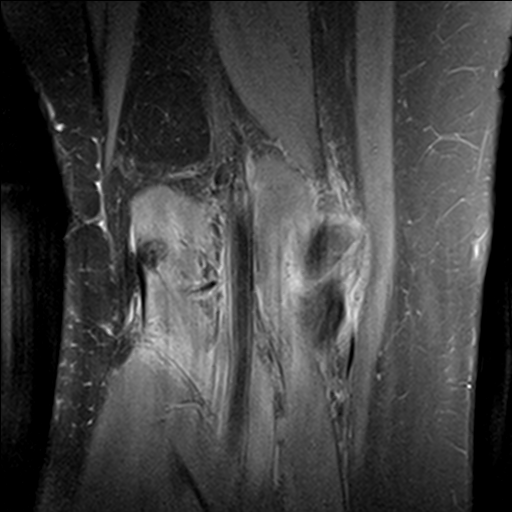
[im 14/33]
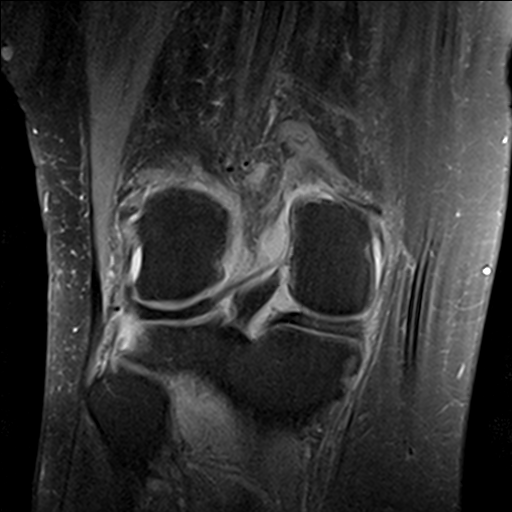
[im 19/33]
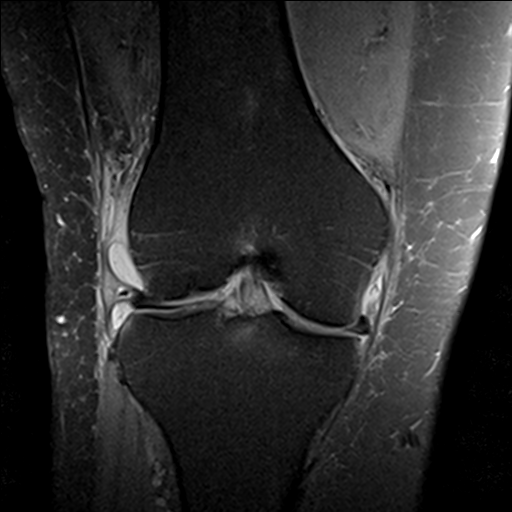
[im 23/33]
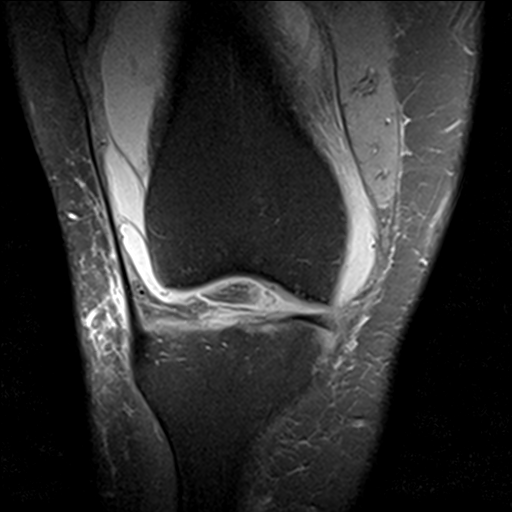
[im 28/33]
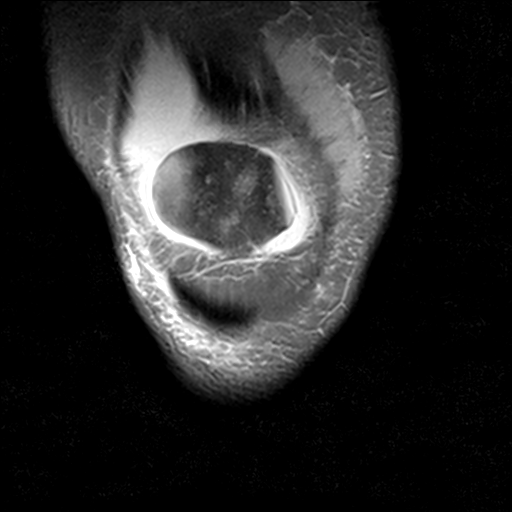

[20 of 40 positions shown; findings below may reference images not displayed]

FINDINGS: MENISCI

Medial meniscus: Horizontal tear in the posterior horn reaches the
meniscal undersurface. No displaced fragment.

Lateral meniscus: Focal blunting along the central aspect of the
free edge of the body is compatible with a radial tear.

LIGAMENTS

Cruciates:  Intact. There is mucoid degeneration of the ACL.

Collaterals:  Intact.

CARTILAGE

Patellofemoral: Fissures in cartilage are seen along both the medial
and lateral patellar facets.

Medial:  Mildly degenerated.

Lateral:  Mildly degenerated.

Joint: The patient has a moderate joint effusion. Synovium about the
knee appears thickened.

Popliteal Fossa: Small Baker's cyst contains debris. There is edema
within the popliteus centered at the musculotendinous junction but
the popliteus tendon is intact.

Extensor Mechanism:  Intact.

Bones: No fracture or focal lesion. Mild subchondral edema at the
patellar apex is noted. There is also some edema in the tibial
eminences at the ACL attachment consistent with reactive change from
mucoid degeneration.

Other: None.
IMPRESSION: 1. Horizontal tear posterior horn medial meniscus.
2. Radial tear along the central aspect of the body of the lateral
meniscus.
3. Edema within the popliteus centered at the musculotendinous
junction could be due to strain or myositis. The popliteus tendon is
intact.
4. Moderate joint effusion with synovial thickening compatible with
synovitis.
5. Mild patellofemoral and medial compartment osteoarthritis.
6. Small Baker's cyst contains debris.
